# Patient Record
Sex: Female | Born: 1975
Health system: Southern US, Community
[De-identification: ages and names within clinical notes are randomized; demographics above are authoritative.]

## PROBLEM LIST (undated history)

## (undated) DIAGNOSIS — K8050 Calculus of bile duct without cholangitis or cholecystitis without obstruction: Secondary | ICD-10-CM

## (undated) DIAGNOSIS — I341 Nonrheumatic mitral (valve) prolapse: Secondary | ICD-10-CM

## (undated) DIAGNOSIS — K219 Gastro-esophageal reflux disease without esophagitis: Secondary | ICD-10-CM

## (undated) HISTORY — DX: Calculus of bile duct without cholangitis or cholecystitis without obstruction: K80.50

## (undated) HISTORY — DX: Nonrheumatic mitral (valve) prolapse: I34.1

## (undated) HISTORY — DX: Gastro-esophageal reflux disease without esophagitis: K21.9

---

## 2002-09-30 ENCOUNTER — Encounter: Admission: RE | Admit: 2002-09-30 | Discharge: 2002-09-30 | Payer: Self-pay | Admitting: General Surgery

## 2002-09-30 ENCOUNTER — Encounter: Payer: Self-pay | Admitting: General Surgery

## 2003-07-10 ENCOUNTER — Encounter: Admission: RE | Admit: 2003-07-10 | Discharge: 2003-07-10 | Payer: Self-pay | Admitting: General Surgery

## 2005-01-27 HISTORY — PX: DILATION AND CURETTAGE OF UTERUS: SHX78

## 2006-08-08 ENCOUNTER — Emergency Department: Payer: Self-pay | Admitting: Emergency Medicine

## 2007-01-01 ENCOUNTER — Ambulatory Visit: Payer: Self-pay

## 2007-11-18 ENCOUNTER — Ambulatory Visit: Payer: Self-pay

## 2009-01-27 DIAGNOSIS — K8050 Calculus of bile duct without cholangitis or cholecystitis without obstruction: Secondary | ICD-10-CM

## 2009-01-27 DIAGNOSIS — K219 Gastro-esophageal reflux disease without esophagitis: Secondary | ICD-10-CM

## 2009-01-27 HISTORY — DX: Calculus of bile duct without cholangitis or cholecystitis without obstruction: K80.50

## 2009-01-27 HISTORY — DX: Gastro-esophageal reflux disease without esophagitis: K21.9

## 2009-03-13 ENCOUNTER — Ambulatory Visit: Payer: Self-pay

## 2010-08-28 ENCOUNTER — Ambulatory Visit: Payer: Self-pay

## 2010-09-10 ENCOUNTER — Ambulatory Visit: Payer: Self-pay | Admitting: Family Medicine

## 2010-09-27 ENCOUNTER — Emergency Department: Payer: Self-pay | Admitting: Emergency Medicine

## 2011-01-28 HISTORY — PX: BREAST BIOPSY: SHX20

## 2011-02-13 ENCOUNTER — Ambulatory Visit: Payer: Self-pay | Admitting: Neurology

## 2011-09-02 ENCOUNTER — Ambulatory Visit: Payer: Self-pay | Admitting: Obstetrics and Gynecology

## 2011-09-11 HISTORY — PX: BREAST SURGERY: SHX581

## 2011-10-13 ENCOUNTER — Ambulatory Visit: Payer: Self-pay | Admitting: General Surgery

## 2012-03-05 ENCOUNTER — Encounter: Payer: Self-pay | Admitting: *Deleted

## 2012-04-12 ENCOUNTER — Ambulatory Visit: Payer: Self-pay | Admitting: General Surgery

## 2012-04-19 ENCOUNTER — Encounter: Payer: Self-pay | Admitting: General Surgery

## 2012-04-19 ENCOUNTER — Ambulatory Visit (INDEPENDENT_AMBULATORY_CARE_PROVIDER_SITE_OTHER): Payer: Managed Care, Other (non HMO) | Admitting: General Surgery

## 2012-04-19 VITALS — BP 115/82 | HR 84 | Resp 14 | Ht 62.5 in | Wt 162.0 lb

## 2012-04-19 DIAGNOSIS — Z09 Encounter for follow-up examination after completed treatment for conditions other than malignant neoplasm: Secondary | ICD-10-CM

## 2012-04-19 NOTE — Patient Instructions (Signed)
Patient advised to have next mammogram at age 37.

## 2012-04-19 NOTE — Progress Notes (Signed)
Patient ID: [name], female   DOB: [birth year], 37 y.o.   MRN: [id number]  Chief Complaint  Patient presents with  . Follow-up    mammogram    HPI Kristie Garcia is a 37 y.o. female here today for her 6 follow up mammogram done @ [facility] on 04/12/12. No breast complaints at this time.  HPI  Past Medical History  Diagnosis Date  . Gall bladder pain 2011  . Mitral valve prolapse   . Acid reflux 2011    Past Surgical History  Procedure Laterality Date  . Dilation and curettage of uterus  2007  . Breast surgery  09/11/2011    left breast 4 oclock    Family History  Problem Relation Age of Onset  . Breast cancer Paternal Grandmother 38  . Breast cancer Maternal Aunt 43  . Lung cancer Paternal Grandfather 7    Social History History  Substance Use Topics  . Smoking status: Never Smoker   . Smokeless tobacco: Never Used  . Alcohol Use: No    Allergies  Allergen Reactions  . Betadine (Povidone Iodine) Hives  . Codeine Nausea And Vomiting  . Penicillins Rash    No current outpatient prescriptions on file.   No current facility-administered medications for this visit.    Review of Systems Review of Systems  Constitutional: Negative.   Respiratory: Negative.   Cardiovascular: Negative.     Blood pressure 115/82, pulse 84, resp. rate 14, height 5' 2.5" (1.588 m), weight 162 lb (73.483 kg), last menstrual period 04/18/2012.  Physical Exam Physical Exam  Constitutional: She appears well-developed and well-nourished.  Neck: Trachea normal and normal range of motion. Neck supple.  Cardiovascular: Normal rate, regular rhythm and normal heart sounds.   Pulmonary/Chest: Right breast exhibits no inverted nipple, no mass, no nipple discharge, no skin change and no tenderness. Left breast exhibits no inverted nipple, no mass, no nipple discharge, no skin change and no tenderness.  Right breast fuller than left. Single duct drainage in right breast.     Data  Reviewed  Unilateral left breast mammogram dated 04/12/2012 showed no interval change. Postbiopsy clip was identified and unchanged from postbiopsy exam on 10/13/2011. BIRAD-2.  Left breast biopsy dated 09/12/2011:  BREAST, LEFT 4:00, BIOPSY *STAT*:  - BENIGN BREAST TISSUE WITH INFLAMED GRANULATION TISSUE, SMALL  ABSCESS FORMATION, REACTIVE STROMAL CHANGES AND FIBROSIS, AND  SURROUNDING DUCT ECTASIA.  - NEGATIVE FOR ATYPIA AND MALIGNANCY.    Assessment    Benign breast exam.    Plan    The patient was encouraged to do monthly self examinations. Bilateral screening mammogram should be completed at age 61       Earline Mayotte 04/19/2012, 9:22 PM  Cc:  Odis Luster, M.D.

## 2012-05-04 ENCOUNTER — Ambulatory Visit: Payer: Self-pay | Admitting: General Practice

## 2012-11-01 ENCOUNTER — Ambulatory Visit (INDEPENDENT_AMBULATORY_CARE_PROVIDER_SITE_OTHER): Payer: Managed Care, Other (non HMO) | Admitting: General Surgery

## 2012-11-01 ENCOUNTER — Encounter: Payer: Self-pay | Admitting: General Surgery

## 2012-11-01 ENCOUNTER — Other Ambulatory Visit: Payer: Managed Care, Other (non HMO)

## 2012-11-01 VITALS — BP 124/68 | HR 74 | Resp 12 | Ht 62.5 in | Wt 157.0 lb

## 2012-11-01 DIAGNOSIS — N63 Unspecified lump in unspecified breast: Secondary | ICD-10-CM

## 2012-11-01 NOTE — Patient Instructions (Addendum)
Patient to continue monthly self breast checks. Patient to follow up in 6 months.

## 2012-11-01 NOTE — Progress Notes (Signed)
Patient ID: [name], female   DOB: 1975-06-02, 37 y.o.   MRN: [id number]  Chief Complaint  Patient presents with  . Follow-up    reevaluation of left breast mass    HPI Kristie Garcia is a 37 y.o. female who presents for a reevaluation of a left breast mass. Most recent mammogram was done on 04/12/12 with a birad category 2. She states since her left breast biopsy in 2013 she has had this pulling sensation as well as occasional pain. She described the pain as a dull ache that lasts about 2 seconds and then it disappears. 8-10 episodes of pain monthly. No patterns associated to the pain. Marble size area in the upper quadrant of the left breast that seems to be getting larger. (12:00 position). Breast identified with the patient seated.   HPI  Past Medical History  Diagnosis Date  . Gall bladder pain 2011  . Mitral valve prolapse   . Acid reflux 2011    Past Surgical History  Procedure Laterality Date  . Dilation and curettage of uterus  2007  . Breast surgery  09/11/2011    left breast 4 oclock    Family History  Problem Relation Age of Onset  . Breast cancer Paternal Grandmother 49  . Breast cancer Maternal Aunt 92  . Lung cancer Paternal Grandfather 80    Social History History  Substance Use Topics  . Smoking status: Never Smoker   . Smokeless tobacco: Never Used  . Alcohol Use: No    Allergies  Allergen Reactions  . Betadine [Povidone Iodine] Hives  . Codeine Nausea And Vomiting  . Penicillins Rash    No current outpatient prescriptions on file.   No current facility-administered medications for this visit.    Review of Systems Review of Systems  Constitutional: Negative.   Respiratory: Negative.   Cardiovascular: Negative.     Blood pressure 124/68, pulse 74, resp. rate 12, height 5' 2.5" (1.588 m), weight 157 lb (71.215 kg), last menstrual period 10/24/2012.  Physical Exam Physical Exam  Constitutional: She is oriented to person, place, and  time. She appears well-developed and well-nourished.  Neck: No thyromegaly present.  Cardiovascular: Normal rate, regular rhythm and normal heart sounds.   No murmur heard. Pulmonary/Chest: Effort normal and breath sounds normal. Right breast exhibits no inverted nipple, no mass, no nipple discharge, no skin change and no tenderness. Left breast exhibits no inverted nipple, no mass, no nipple discharge, no skin change and no tenderness.    Little thickening at 12 o'clock 12 cm from the nipple of the left breast.   Lymphadenopathy:    She has no cervical adenopathy.    She has no axillary adenopathy.  Neurological: She is alert and oriented to person, place, and time.  Skin: Skin is warm and dry.    Data Reviewed Left breast mammogram dated 04/12/2012 showed scattered fibroglandular tissue. Postbiopsy clip in the inferior portion of the central left breast. BI-RAD-2. Ultrasound examination of the left breast in the 11:00 position, 10 cm from the nipple showed a small 0.15 x 0.34 x 0.5 cm simple cyst. At the 6:00 position, 5 cm from the nipple a multilobulated hypoechoic area with faint acoustic enhancement measuring 0.2 x 0.5 one by 0.52 cm was appreciated. The patient's previous biopsy was in the 4:00 position of the left breast, 5 cm from the nipple.  Assessment    Benign breast exam.    Plan    We'll arrange for a  followup exam with a repeat ultrasound of the left breast to be undertaken at that time.       Earline Mayotte 11/02/2012, 12:53 PM

## 2012-11-02 ENCOUNTER — Encounter: Payer: Self-pay | Admitting: General Surgery

## 2012-11-11 ENCOUNTER — Encounter: Payer: Self-pay | Admitting: General Surgery

## 2012-12-02 ENCOUNTER — Other Ambulatory Visit: Payer: Self-pay

## 2013-04-26 ENCOUNTER — Ambulatory Visit: Payer: Managed Care, Other (non HMO) | Admitting: General Surgery

## 2013-05-04 ENCOUNTER — Ambulatory Visit: Payer: Managed Care, Other (non HMO) | Admitting: General Surgery

## 2013-05-12 ENCOUNTER — Ambulatory Visit: Payer: Managed Care, Other (non HMO) | Admitting: General Surgery

## 2013-05-26 ENCOUNTER — Encounter: Payer: Self-pay | Admitting: *Deleted

## 2013-07-08 ENCOUNTER — Other Ambulatory Visit: Payer: Self-pay | Admitting: Emergency Medicine

## 2013-07-08 LAB — CBC
HCT: 43 % (ref 35.0–47.0)
HGB: 14.7 g/dL (ref 12.0–16.0)
MCH: 30.3 pg (ref 26.0–34.0)
MCHC: 34.2 g/dL (ref 32.0–36.0)
MCV: 89 fL (ref 80–100)
PLATELETS: 207 10*3/uL (ref 150–440)
RBC: 4.85 10*6/uL (ref 3.80–5.20)
RDW: 12.5 % (ref 11.5–14.5)
WBC: 6.3 10*3/uL (ref 3.6–11.0)

## 2013-07-08 LAB — COMPREHENSIVE METABOLIC PANEL
AST: 12 U/L — AB (ref 15–37)
Albumin: 4.1 g/dL (ref 3.4–5.0)
Alkaline Phosphatase: 68 U/L
Anion Gap: 6 — ABNORMAL LOW (ref 7–16)
BILIRUBIN TOTAL: 0.9 mg/dL (ref 0.2–1.0)
BUN: 9 mg/dL (ref 7–18)
CHLORIDE: 105 mmol/L (ref 98–107)
Calcium, Total: 9 mg/dL (ref 8.5–10.1)
Co2: 28 mmol/L (ref 21–32)
Creatinine: 0.75 mg/dL (ref 0.60–1.30)
GLUCOSE: 104 mg/dL — AB (ref 65–99)
OSMOLALITY: 277 (ref 275–301)
POTASSIUM: 3.6 mmol/L (ref 3.5–5.1)
SGPT (ALT): 19 U/L (ref 12–78)
SODIUM: 139 mmol/L (ref 136–145)
Total Protein: 7.1 g/dL (ref 6.4–8.2)

## 2013-07-08 LAB — TSH: Thyroid Stimulating Horm: 2.59 u[IU]/mL

## 2013-09-05 ENCOUNTER — Ambulatory Visit: Payer: BC Managed Care – PPO

## 2013-09-05 ENCOUNTER — Ambulatory Visit (INDEPENDENT_AMBULATORY_CARE_PROVIDER_SITE_OTHER): Payer: BC Managed Care – PPO | Admitting: General Surgery

## 2013-09-05 ENCOUNTER — Encounter: Payer: Self-pay | Admitting: General Surgery

## 2013-09-05 VITALS — BP 124/76 | HR 76 | Resp 12 | Ht 62.5 in | Wt 165.0 lb

## 2013-09-05 DIAGNOSIS — N63 Unspecified lump in unspecified breast: Secondary | ICD-10-CM

## 2013-09-05 NOTE — Patient Instructions (Signed)
Patient to return as needed. The patient is aware to call back for any questions or concerns. 

## 2013-09-05 NOTE — Progress Notes (Signed)
Patient ID: Kristie LaundryMissy M Garcia, female   DOB: 01-Sep-1975, 38 y.o.   MRN: [id number]  Chief Complaint  Patient presents with  . Follow-up    6 month follow up breast cyst    HPI Kristie Garcia is a 38 y.o. female who presents for a 6 month follow up of a left breast cyst. The patient denies any new problems at this time.   HPI  Past Medical History  Diagnosis Date  . Gall bladder pain 2011  . Mitral valve prolapse   . Acid reflux 2011    Past Surgical History  Procedure Laterality Date  . Dilation and curettage of uterus  2007  . Breast surgery  09/11/2011    left breast 4 oclock    Family History  Problem Relation Age of Onset  . Breast cancer Paternal Grandmother 3468  . Breast cancer Maternal Aunt 8368  . Lung cancer Paternal Grandfather 3060    Social History History  Substance Use Topics  . Smoking status: Never Smoker   . Smokeless tobacco: Never Used  . Alcohol Use: No    Allergies  Allergen Reactions  . Betadine [Povidone Iodine] Hives  . Codeine Nausea And Vomiting  . Penicillins Rash    No current outpatient prescriptions on file.   No current facility-administered medications for this visit.    Review of Systems Review of Systems  Constitutional: Negative.   Respiratory: Negative.   Cardiovascular: Negative.     Blood pressure 124/76, pulse 76, resp. rate 12, height 5' 2.5" (1.588 m), weight 165 lb (74.844 kg), last menstrual period 08/15/2013.  Physical Exam Physical Exam  Constitutional: She is oriented to person, place, and time. She appears well-developed and well-nourished.  Neck: Neck supple. No thyromegaly present.  Cardiovascular: Normal rate, regular rhythm and normal heart sounds.   No murmur heard. Pulmonary/Chest: Effort normal and breath sounds normal. Right breast exhibits no inverted nipple, no mass, no nipple discharge, no skin change and no tenderness. Left breast exhibits mass. Left breast exhibits no inverted nipple, no nipple  discharge, no skin change and no tenderness.  Left breast 11 o'clock 13 cm from the nipple mild prominence is noted when examined in the seated position. In the supine position this is less distinct.  Lymphadenopathy:    She has no cervical adenopathy.    She has no axillary adenopathy.  Neurological: She is alert and oriented to person, place, and time.  Skin: Skin is warm and dry.    Data Reviewed Ultrasound examination of the 11 o'clock position 10 cm no nipple again shows a lenticular shape area measuring less than 0.5 cm in diameter. This is slightly smaller than on her last exam.no acoustic enhancement are shadowing. This is indeterminate, but as is decreased in size no further intervention is required. At the 6 o'clock position the previously identified ill-defined nodule is no longer evident. Examination of the retroareolar area shows mildly prominent ductal structures measuring up to 0.33 cm in diameter. No intraductal lesions are noted. BI-RAD-2.  Assessment    Benign breast exam.     Plan    The patient should have a screening mammogram at age 38. This can be arranged to the GYN office. She was encouraged to call should appreciate any change in her breast exam, followup here otherwise will be on an as-needed basis.      PCP: Kristie Garcia   Kristie Garcia 09/05/2013, 8:51 PM

## 2013-11-28 ENCOUNTER — Encounter: Payer: Self-pay | Admitting: General Surgery

## 2014-04-12 ENCOUNTER — Encounter: Payer: Self-pay | Admitting: General Surgery

## 2014-04-12 ENCOUNTER — Ambulatory Visit: Payer: Self-pay | Admitting: General Surgery

## 2015-09-28 ENCOUNTER — Other Ambulatory Visit: Payer: Self-pay | Admitting: Nurse Practitioner

## 2015-09-28 DIAGNOSIS — R1013 Epigastric pain: Secondary | ICD-10-CM

## 2015-09-28 DIAGNOSIS — Z8719 Personal history of other diseases of the digestive system: Secondary | ICD-10-CM

## 2015-10-04 ENCOUNTER — Encounter: Admission: RE | Admit: 2015-10-04 | Payer: 59 | Source: Ambulatory Visit

## 2015-10-05 ENCOUNTER — Ambulatory Visit
Admission: RE | Admit: 2015-10-05 | Discharge: 2015-10-05 | Disposition: A | Discharge status: Home / Self Care | Payer: 59 | Source: Ambulatory Visit | Attending: Nurse Practitioner | Admitting: Nurse Practitioner

## 2015-10-05 ENCOUNTER — Ambulatory Visit: Payer: 59

## 2015-10-05 DIAGNOSIS — Z8719 Personal history of other diseases of the digestive system: Secondary | ICD-10-CM | POA: Insufficient documentation

## 2015-10-05 DIAGNOSIS — R1013 Epigastric pain: Secondary | ICD-10-CM | POA: Diagnosis present

## 2016-09-05 DIAGNOSIS — I493 Ventricular premature depolarization: Secondary | ICD-10-CM | POA: Diagnosis not present

## 2016-09-05 DIAGNOSIS — I341 Nonrheumatic mitral (valve) prolapse: Secondary | ICD-10-CM | POA: Diagnosis not present

## 2016-09-05 DIAGNOSIS — R002 Palpitations: Secondary | ICD-10-CM | POA: Diagnosis not present

## 2016-09-11 DIAGNOSIS — R002 Palpitations: Secondary | ICD-10-CM | POA: Diagnosis not present

## 2016-09-11 DIAGNOSIS — I10 Essential (primary) hypertension: Secondary | ICD-10-CM | POA: Diagnosis not present

## 2016-09-11 DIAGNOSIS — I493 Ventricular premature depolarization: Secondary | ICD-10-CM | POA: Diagnosis not present

## 2016-09-24 ENCOUNTER — Encounter: Payer: Self-pay | Admitting: Physician Assistant

## 2016-09-24 ENCOUNTER — Ambulatory Visit: Payer: Self-pay | Admitting: Physician Assistant

## 2016-09-24 VITALS — BP 122/80 | HR 71 | Temp 98.5°F

## 2016-09-24 DIAGNOSIS — Z7189 Other specified counseling: Secondary | ICD-10-CM

## 2016-09-24 NOTE — Progress Notes (Signed)
S: pt c/o feeling like she has a lump in her throat, states she had been having palpitations and was given propranolol, said she has been taking bid for 5 days, worried that the sensation is from the medication, denies rash, itching, dif breathing, throat or lips swelling, headaches, n/v  O: vitals wnl nad, mouth and throat wnl, neck supple no lymph, lungs c t a,cv rrr, skin without rash  A: medication counseling  P: if sx worsen then f/u with pcp, if dif breathing go to ER

## 2016-12-11 DIAGNOSIS — Z23 Encounter for immunization: Secondary | ICD-10-CM | POA: Diagnosis not present

## 2016-12-11 DIAGNOSIS — Z0000 Encounter for general adult medical examination without abnormal findings: Secondary | ICD-10-CM | POA: Diagnosis not present

## 2016-12-11 DIAGNOSIS — G43109 Migraine with aura, not intractable, without status migrainosus: Secondary | ICD-10-CM | POA: Diagnosis not present

## 2016-12-11 DIAGNOSIS — K219 Gastro-esophageal reflux disease without esophagitis: Secondary | ICD-10-CM | POA: Diagnosis not present

## 2016-12-15 DIAGNOSIS — Z0000 Encounter for general adult medical examination without abnormal findings: Secondary | ICD-10-CM | POA: Diagnosis not present

## 2016-12-30 DIAGNOSIS — L578 Other skin changes due to chronic exposure to nonionizing radiation: Secondary | ICD-10-CM | POA: Diagnosis not present

## 2016-12-30 DIAGNOSIS — Z86018 Personal history of other benign neoplasm: Secondary | ICD-10-CM | POA: Diagnosis not present

## 2017-08-13 ENCOUNTER — Other Ambulatory Visit: Payer: Self-pay | Admitting: Obstetrics & Gynecology

## 2017-08-13 DIAGNOSIS — Z01419 Encounter for gynecological examination (general) (routine) without abnormal findings: Secondary | ICD-10-CM | POA: Diagnosis not present

## 2017-08-13 DIAGNOSIS — Z1231 Encounter for screening mammogram for malignant neoplasm of breast: Secondary | ICD-10-CM | POA: Diagnosis not present

## 2017-08-13 DIAGNOSIS — N6452 Nipple discharge: Secondary | ICD-10-CM | POA: Diagnosis not present

## 2017-08-14 ENCOUNTER — Ambulatory Visit
Admission: RE | Admit: 2017-08-14 | Discharge: 2017-08-14 | Disposition: A | Discharge status: Home / Self Care | Payer: 59 | Source: Ambulatory Visit | Attending: Obstetrics & Gynecology | Admitting: Obstetrics & Gynecology

## 2017-08-14 DIAGNOSIS — Z1231 Encounter for screening mammogram for malignant neoplasm of breast: Secondary | ICD-10-CM

## 2017-08-17 DIAGNOSIS — I493 Ventricular premature depolarization: Secondary | ICD-10-CM | POA: Diagnosis not present

## 2017-08-17 DIAGNOSIS — E782 Mixed hyperlipidemia: Secondary | ICD-10-CM | POA: Diagnosis not present

## 2017-08-17 DIAGNOSIS — I10 Essential (primary) hypertension: Secondary | ICD-10-CM | POA: Diagnosis not present

## 2017-11-13 DIAGNOSIS — R1013 Epigastric pain: Secondary | ICD-10-CM | POA: Diagnosis not present

## 2017-11-13 DIAGNOSIS — R1011 Right upper quadrant pain: Secondary | ICD-10-CM | POA: Diagnosis not present

## 2017-11-20 ENCOUNTER — Other Ambulatory Visit: Payer: Self-pay | Admitting: Family Medicine

## 2017-11-20 DIAGNOSIS — R1011 Right upper quadrant pain: Secondary | ICD-10-CM

## 2017-11-23 ENCOUNTER — Ambulatory Visit
Admission: RE | Admit: 2017-11-23 | Discharge: 2017-11-23 | Disposition: A | Discharge status: Home / Self Care | Payer: 59 | Source: Ambulatory Visit | Attending: Family Medicine | Admitting: Family Medicine

## 2017-11-23 DIAGNOSIS — R1011 Right upper quadrant pain: Secondary | ICD-10-CM | POA: Diagnosis not present

## 2017-11-23 DIAGNOSIS — R1013 Epigastric pain: Secondary | ICD-10-CM | POA: Insufficient documentation

## 2017-12-15 DIAGNOSIS — K219 Gastro-esophageal reflux disease without esophagitis: Secondary | ICD-10-CM | POA: Diagnosis not present

## 2017-12-15 DIAGNOSIS — G43109 Migraine with aura, not intractable, without status migrainosus: Secondary | ICD-10-CM | POA: Diagnosis not present

## 2017-12-15 DIAGNOSIS — Z0000 Encounter for general adult medical examination without abnormal findings: Secondary | ICD-10-CM | POA: Diagnosis not present

## 2017-12-15 DIAGNOSIS — Z23 Encounter for immunization: Secondary | ICD-10-CM | POA: Diagnosis not present

## 2018-03-29 DIAGNOSIS — I493 Ventricular premature depolarization: Secondary | ICD-10-CM | POA: Diagnosis not present

## 2018-03-29 DIAGNOSIS — K219 Gastro-esophageal reflux disease without esophagitis: Secondary | ICD-10-CM | POA: Diagnosis not present

## 2018-06-18 DIAGNOSIS — H169 Unspecified keratitis: Secondary | ICD-10-CM | POA: Diagnosis not present

## 2018-12-21 ENCOUNTER — Other Ambulatory Visit: Payer: Self-pay | Admitting: Family Medicine

## 2018-12-21 DIAGNOSIS — Z1231 Encounter for screening mammogram for malignant neoplasm of breast: Secondary | ICD-10-CM

## 2018-12-30 ENCOUNTER — Ambulatory Visit
Admission: RE | Admit: 2018-12-30 | Discharge: 2018-12-30 | Disposition: A | Discharge status: Home / Self Care | Payer: 59 | Source: Ambulatory Visit | Attending: Family Medicine | Admitting: Family Medicine

## 2018-12-30 ENCOUNTER — Other Ambulatory Visit: Payer: Self-pay

## 2018-12-30 DIAGNOSIS — Z1231 Encounter for screening mammogram for malignant neoplasm of breast: Secondary | ICD-10-CM | POA: Insufficient documentation

## 2019-01-05 ENCOUNTER — Ambulatory Visit
Admission: RE | Admit: 2019-01-05 | Discharge: 2019-01-05 | Disposition: A | Discharge status: Home / Self Care | Payer: 59 | Source: Ambulatory Visit | Attending: Family Medicine | Admitting: Family Medicine

## 2019-01-05 ENCOUNTER — Other Ambulatory Visit: Payer: Self-pay

## 2019-01-05 DIAGNOSIS — Z1231 Encounter for screening mammogram for malignant neoplasm of breast: Secondary | ICD-10-CM | POA: Insufficient documentation

## 2020-03-15 ENCOUNTER — Other Ambulatory Visit: Payer: Self-pay | Admitting: Family Medicine

## 2020-03-15 DIAGNOSIS — Z1231 Encounter for screening mammogram for malignant neoplasm of breast: Secondary | ICD-10-CM

## 2020-03-30 ENCOUNTER — Other Ambulatory Visit: Payer: Self-pay

## 2020-03-30 ENCOUNTER — Ambulatory Visit
Admission: RE | Admit: 2020-03-30 | Discharge: 2020-03-30 | Disposition: A | Discharge status: Home / Self Care | Payer: No Typology Code available for payment source | Source: Ambulatory Visit | Attending: Family Medicine | Admitting: Family Medicine

## 2020-03-30 DIAGNOSIS — Z1231 Encounter for screening mammogram for malignant neoplasm of breast: Secondary | ICD-10-CM | POA: Insufficient documentation

## 2020-11-27 IMAGING — MG DIGITAL SCREENING BILAT W/ TOMO W/ CAD
8 series · 8 of 24 positions shown · non-contrast
Comparison: Previous exam(s).

CLINICAL DATA: Screening.

EXAM:
DIGITAL SCREENING BILATERAL MAMMOGRAM WITH TOMO AND CAD

[L MLO synth-2D]
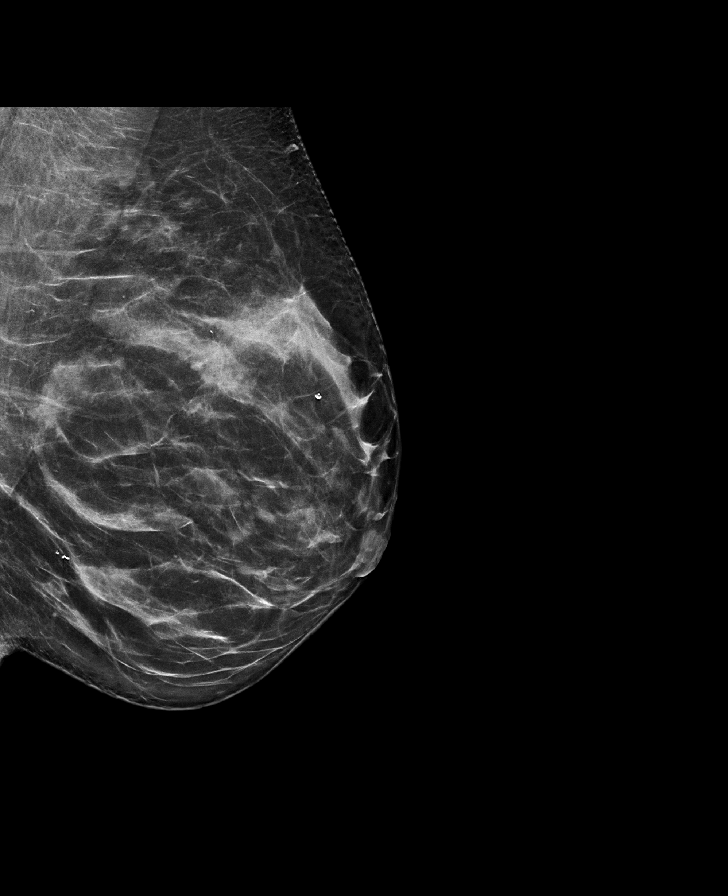

[L CC synth-2D]
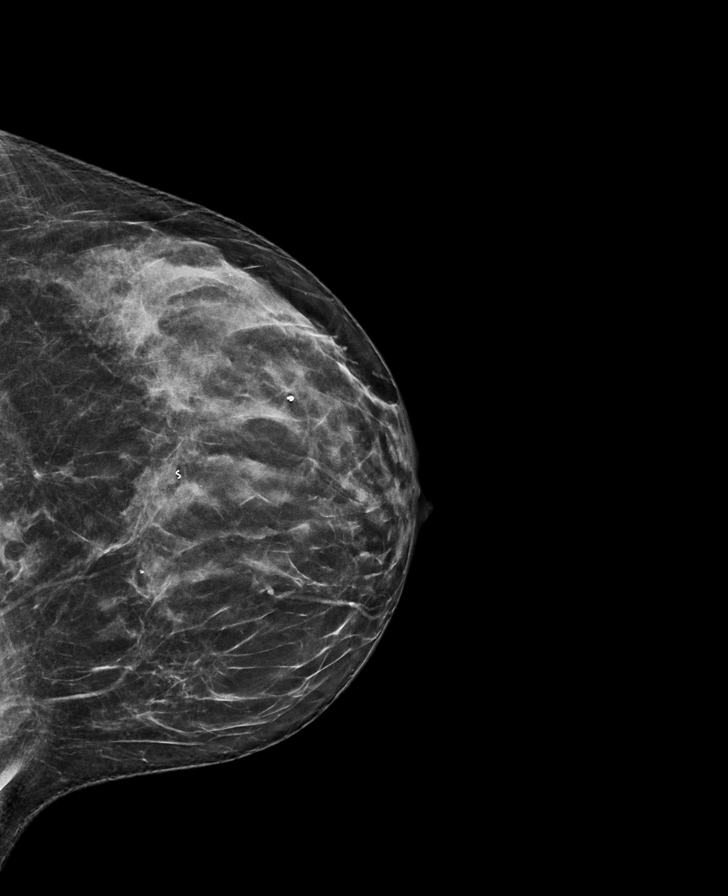

[R CC synth-2D]
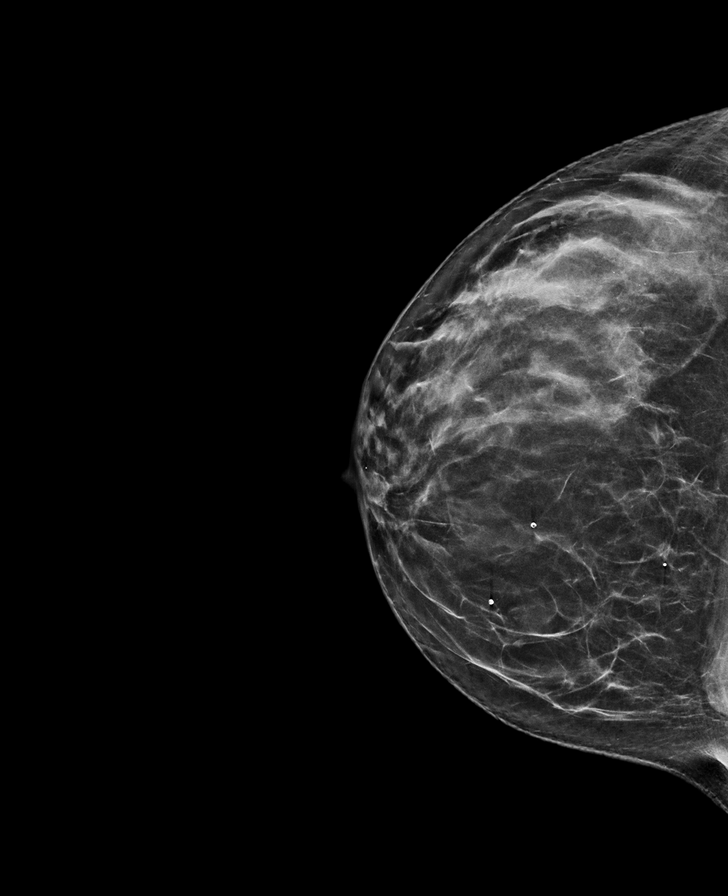

[R MLO synth-2D]
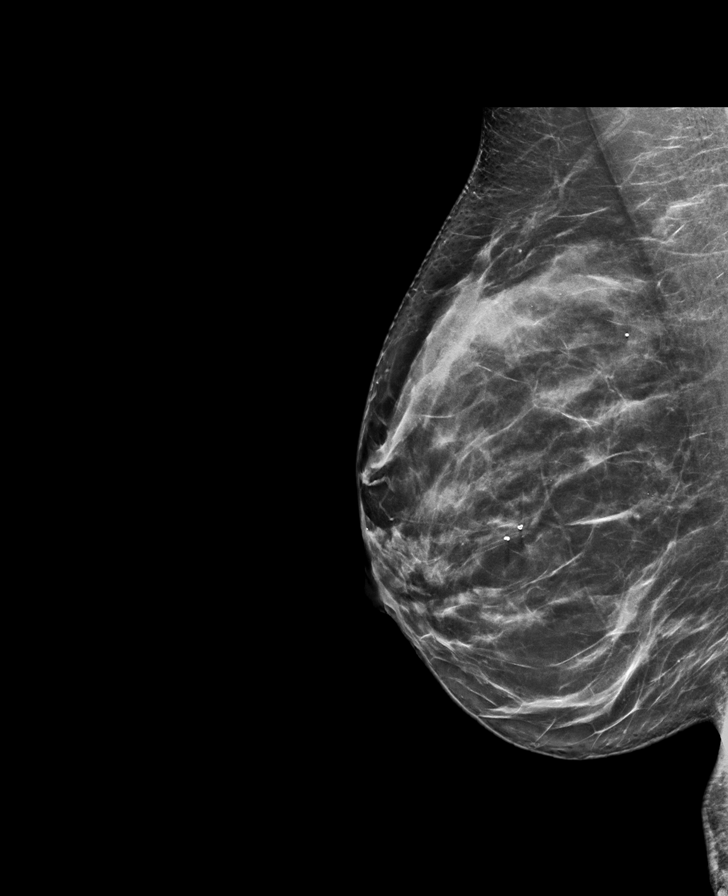

[R MLO tomo · tomo slice 37/73.0]
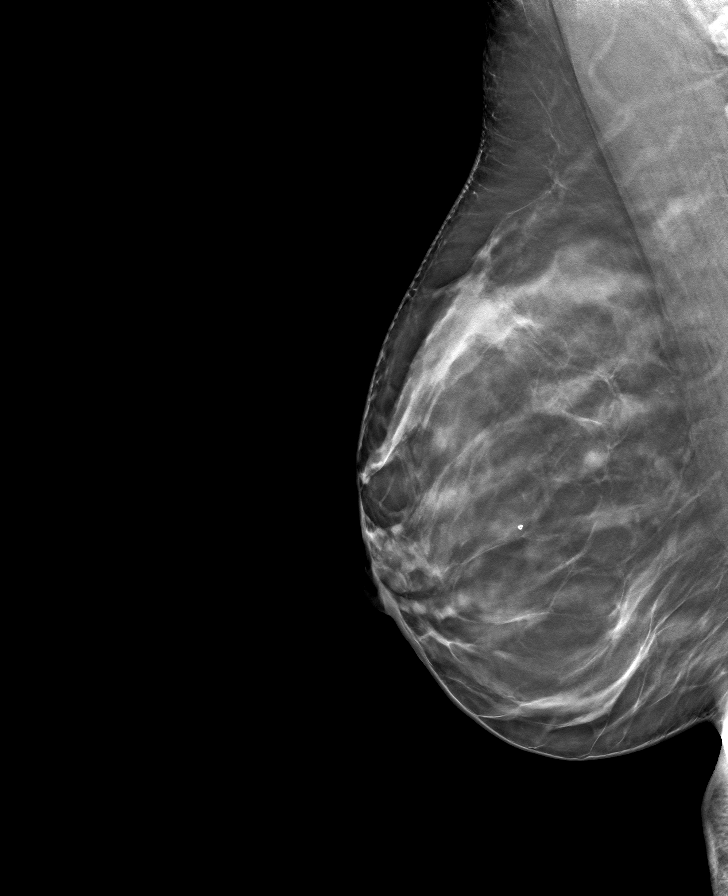

[L CC tomo · tomo slice 33/64.0]
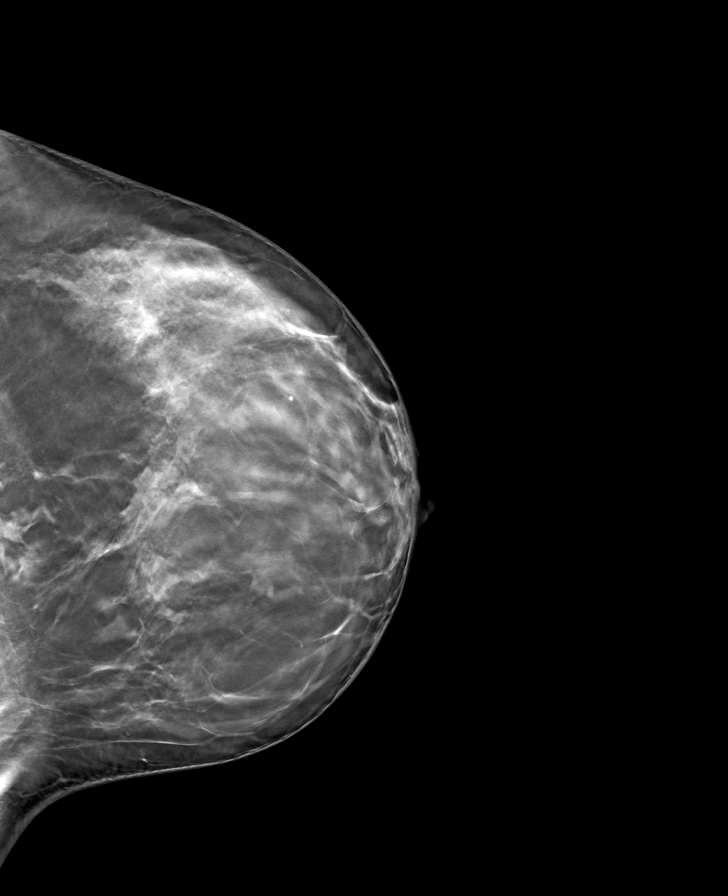

[R CC tomo · tomo slice 34/67.0]
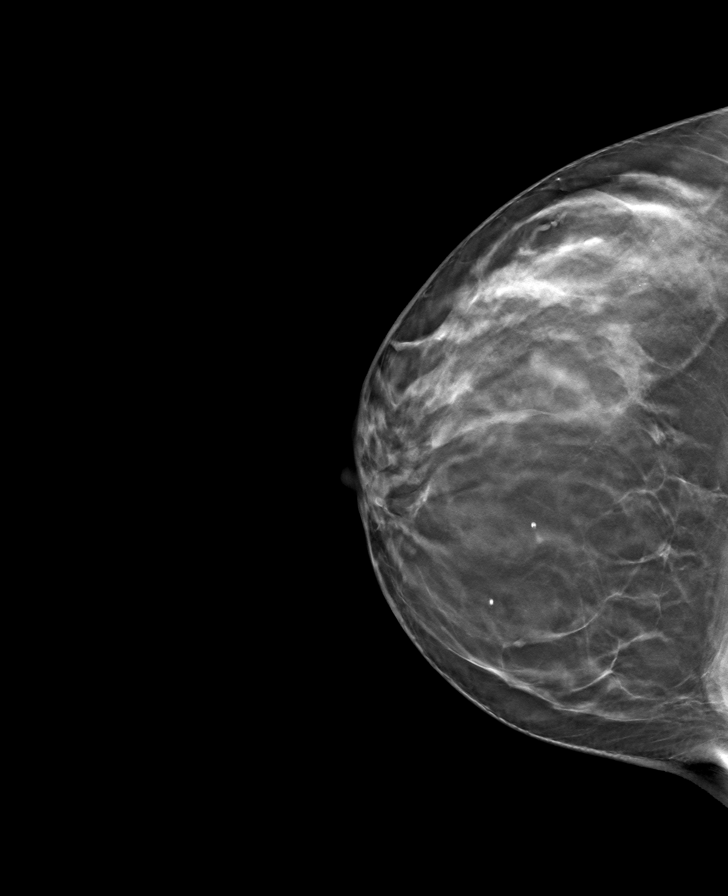

[L MLO tomo · tomo slice 36/71.0]
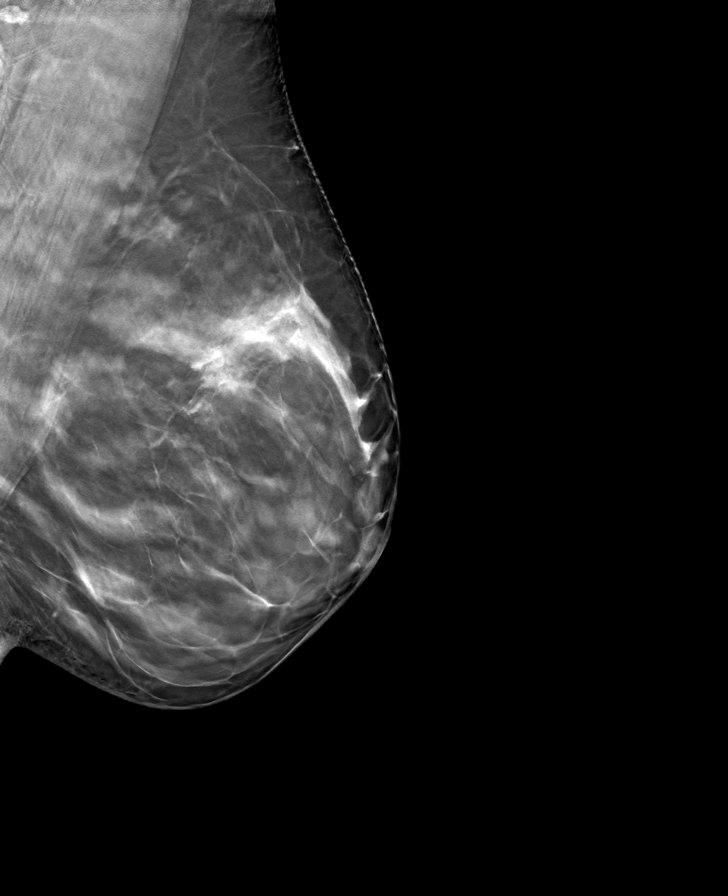

[8 of 24 positions shown; findings below may reference images not displayed]

ACR Breast Density Category c: The breast tissue is heterogeneously
dense, which may obscure small masses.
FINDINGS: There are no findings suspicious for malignancy. Images were
processed with CAD.
IMPRESSION: No mammographic evidence of malignancy. A result letter of this
screening mammogram will be mailed directly to the patient.

RECOMMENDATION:
Screening mammogram in one year. (Code:FT-U-LHB)

BI-RADS CATEGORY  1: Negative.

## 2021-05-02 ENCOUNTER — Other Ambulatory Visit: Payer: Self-pay | Admitting: Family Medicine

## 2021-05-02 DIAGNOSIS — Z1231 Encounter for screening mammogram for malignant neoplasm of breast: Secondary | ICD-10-CM

## 2021-06-07 ENCOUNTER — Ambulatory Visit
Admission: RE | Admit: 2021-06-07 | Discharge: 2021-06-07 | Disposition: A | Discharge status: Home / Self Care | Payer: No Typology Code available for payment source | Source: Ambulatory Visit | Attending: Family Medicine | Admitting: Family Medicine

## 2021-06-07 DIAGNOSIS — Z1231 Encounter for screening mammogram for malignant neoplasm of breast: Secondary | ICD-10-CM | POA: Diagnosis present

## 2021-06-13 ENCOUNTER — Other Ambulatory Visit: Payer: Self-pay | Admitting: Family Medicine

## 2021-06-13 DIAGNOSIS — R928 Other abnormal and inconclusive findings on diagnostic imaging of breast: Secondary | ICD-10-CM

## 2021-06-13 DIAGNOSIS — R921 Mammographic calcification found on diagnostic imaging of breast: Secondary | ICD-10-CM

## 2021-06-19 ENCOUNTER — Ambulatory Visit
Admission: RE | Admit: 2021-06-19 | Discharge: 2021-06-19 | Disposition: A | Discharge status: Home / Self Care | Payer: No Typology Code available for payment source | Source: Ambulatory Visit | Attending: Family Medicine | Admitting: Family Medicine

## 2021-06-19 DIAGNOSIS — R928 Other abnormal and inconclusive findings on diagnostic imaging of breast: Secondary | ICD-10-CM | POA: Diagnosis present

## 2021-06-19 DIAGNOSIS — R921 Mammographic calcification found on diagnostic imaging of breast: Secondary | ICD-10-CM | POA: Diagnosis present

## 2021-06-21 ENCOUNTER — Other Ambulatory Visit: Payer: Self-pay | Admitting: Family Medicine

## 2021-06-21 DIAGNOSIS — R921 Mammographic calcification found on diagnostic imaging of breast: Secondary | ICD-10-CM

## 2021-06-21 DIAGNOSIS — R928 Other abnormal and inconclusive findings on diagnostic imaging of breast: Secondary | ICD-10-CM

## 2021-07-09 ENCOUNTER — Ambulatory Visit
Admission: RE | Admit: 2021-07-09 | Discharge: 2021-07-09 | Disposition: A | Discharge status: Home / Self Care | Payer: No Typology Code available for payment source | Source: Ambulatory Visit | Attending: Family Medicine | Admitting: Family Medicine

## 2021-07-09 DIAGNOSIS — R928 Other abnormal and inconclusive findings on diagnostic imaging of breast: Secondary | ICD-10-CM

## 2021-07-09 DIAGNOSIS — R921 Mammographic calcification found on diagnostic imaging of breast: Secondary | ICD-10-CM | POA: Diagnosis present

## 2021-07-09 HISTORY — PX: BREAST BIOPSY: SHX20

## 2021-07-10 LAB — SURGICAL PATHOLOGY

## 2022-04-15 ENCOUNTER — Other Ambulatory Visit: Payer: Self-pay | Admitting: Family Medicine

## 2022-04-15 DIAGNOSIS — Z1231 Encounter for screening mammogram for malignant neoplasm of breast: Secondary | ICD-10-CM

## 2022-06-20 ENCOUNTER — Ambulatory Visit
Admission: RE | Admit: 2022-06-20 | Discharge: 2022-06-20 | Disposition: A | Discharge status: Home / Self Care | Payer: No Typology Code available for payment source | Source: Ambulatory Visit | Attending: Family Medicine | Admitting: Family Medicine

## 2022-06-20 DIAGNOSIS — Z1231 Encounter for screening mammogram for malignant neoplasm of breast: Secondary | ICD-10-CM | POA: Diagnosis present

## 2022-08-13 ENCOUNTER — Ambulatory Visit: Payer: No Typology Code available for payment source

## 2022-08-13 DIAGNOSIS — Z83719 Family history of colon polyps, unspecified: Secondary | ICD-10-CM | POA: Diagnosis not present

## 2022-08-13 DIAGNOSIS — Z1211 Encounter for screening for malignant neoplasm of colon: Secondary | ICD-10-CM | POA: Diagnosis not present

## 2022-08-13 DIAGNOSIS — K621 Rectal polyp: Secondary | ICD-10-CM | POA: Diagnosis not present

## 2022-11-21 ENCOUNTER — Other Ambulatory Visit: Payer: Self-pay | Admitting: Obstetrics and Gynecology

## 2022-11-21 DIAGNOSIS — Z1231 Encounter for screening mammogram for malignant neoplasm of breast: Secondary | ICD-10-CM

## 2023-04-30 ENCOUNTER — Other Ambulatory Visit: Payer: Self-pay | Admitting: Family Medicine

## 2023-04-30 DIAGNOSIS — Z1231 Encounter for screening mammogram for malignant neoplasm of breast: Secondary | ICD-10-CM

## 2023-05-12 IMAGING — MG MM DIGITAL DIAGNOSTIC UNILAT*R* W/ TOMO W/ CAD
4 series · 4 of 8 positions shown · non-contrast
Comparison: Previous exam(s).

CLINICAL DATA: Callback for RIGHT breast calcifications, seen on
MLO imaging

EXAM:
DIGITAL DIAGNOSTIC UNILATERAL RIGHT MAMMOGRAM WITH TOMOSYNTHESIS AND
CAD
TECHNIQUE: Right digital diagnostic mammography and breast tomosynthesis was
performed. The images were evaluated with computer-aided detection.

[R CC]
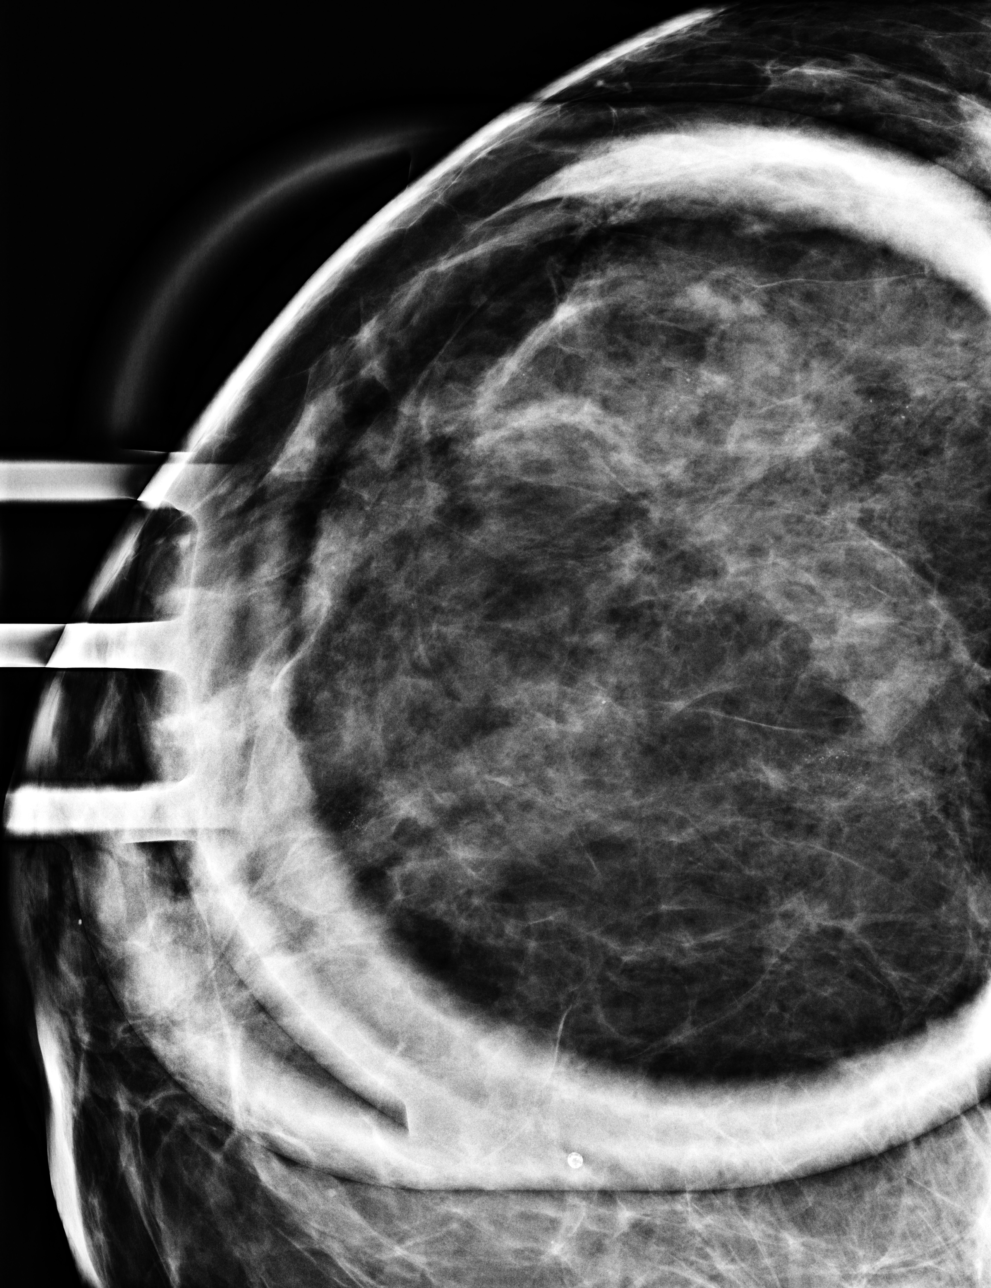

[R ML]
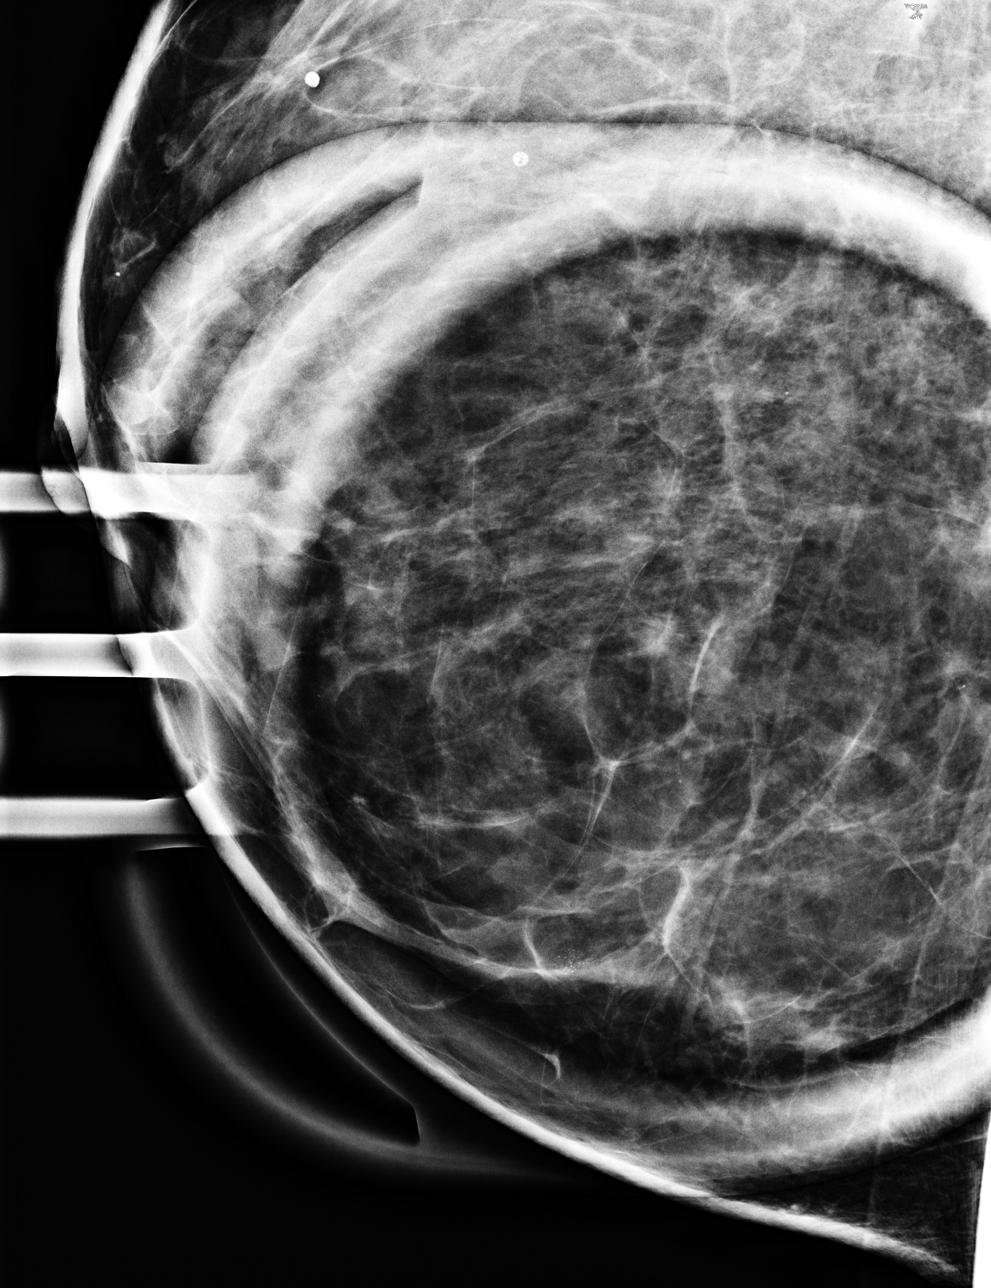

[R ML synth-2D]
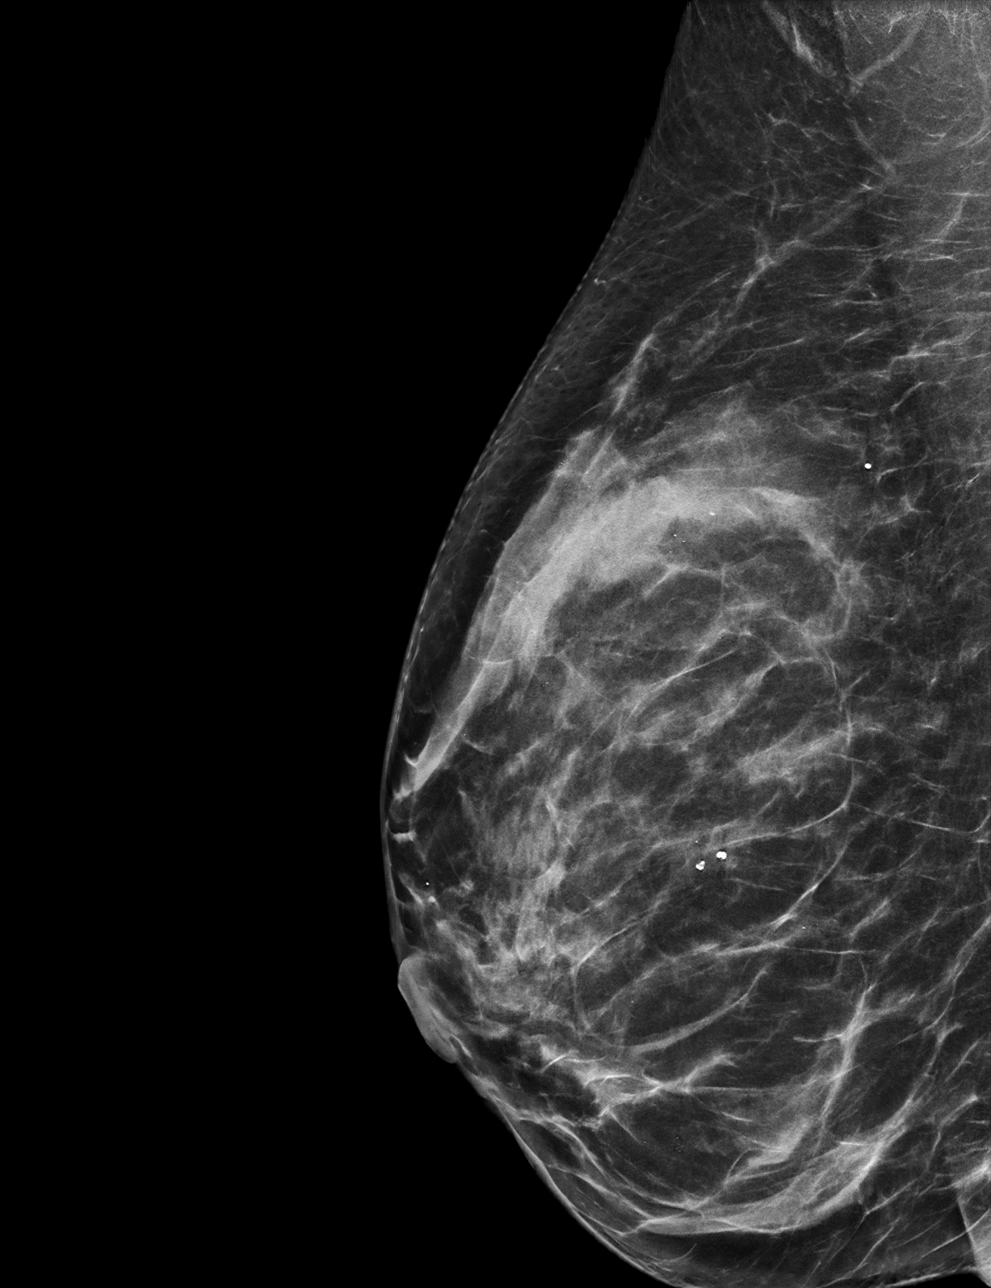

[R ML tomo · tomo slice 33/66.0]
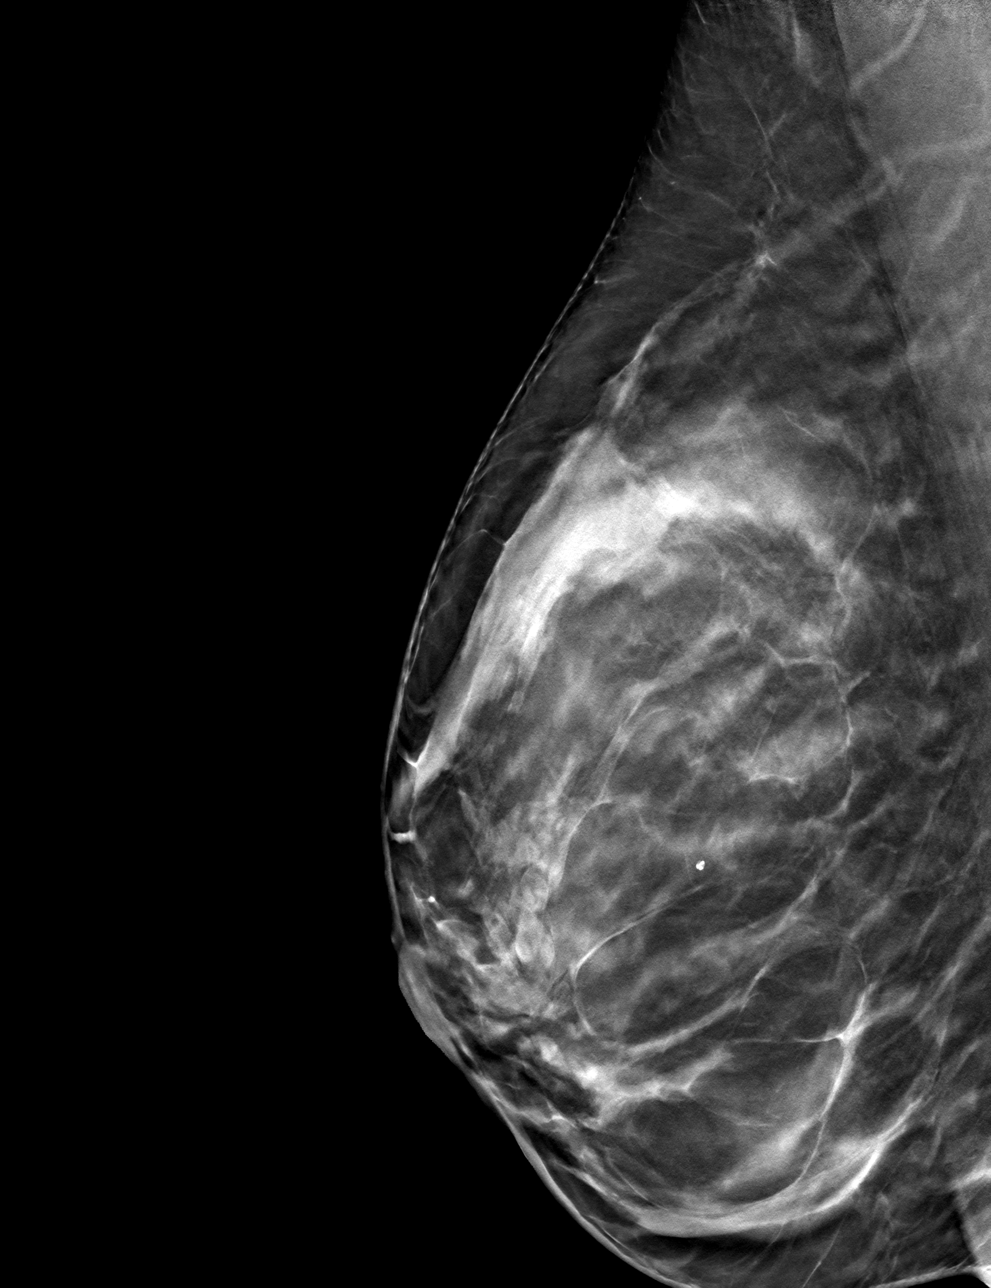

[4 of 8 positions shown; findings below may reference images not displayed]

ACR Breast Density Category c: The breast tissue is heterogeneously
dense, which may obscure small masses.
FINDINGS: Spot magnification views of the RIGHT breast demonstrate a 4 mm
group of punctate calcifications in the RIGHT lower outer breast at
middle depth. This group of calcifications is not definitively
stable in comparison to remote prior mammograms.

There are multiple scattered punctate calcifications noted on spot
CC magnification views throughout the RIGHT outer breast, limiting
ability to identify prospective CC correlate. These additional
scattered punctate calcifications are not significantly changed in
comparison to remote prior mammograms, most consistent with a benign
etiology.
IMPRESSION: 1. There is a 4 mm group of indeterminate punctate calcifications in
the RIGHT lower outer breast, best identified on true lateral
imaging. These are not definitively stable in comparison to remote
prior mammograms. Recommend stereotactic guided biopsy for
definitive characterization.

RECOMMENDATION:
RIGHT breast stereotactic guided biopsy x1

I have discussed the findings and recommendations with the patient.
The biopsy procedure was discussed with the patient and questions
were answered. Patient expressed their understanding of the biopsy
recommendation. Patient will be scheduled for biopsy at her earliest
convenience by the schedulers. Ordering provider will be notified.
If applicable, a reminder letter will be sent to the patient
regarding the next appointment.

BI-RADS CATEGORY  4: Suspicious.

## 2023-06-29 ENCOUNTER — Encounter

## 2023-08-25 ENCOUNTER — Ambulatory Visit
Admission: RE | Admit: 2023-08-25 | Discharge: 2023-08-25 | Disposition: A | Discharge status: Home / Self Care | Source: Ambulatory Visit | Attending: Family Medicine | Admitting: Family Medicine

## 2023-08-25 DIAGNOSIS — Z1231 Encounter for screening mammogram for malignant neoplasm of breast: Secondary | ICD-10-CM | POA: Diagnosis present

## 2023-09-01 ENCOUNTER — Other Ambulatory Visit: Payer: Self-pay | Admitting: Family Medicine

## 2023-09-01 DIAGNOSIS — R928 Other abnormal and inconclusive findings on diagnostic imaging of breast: Secondary | ICD-10-CM

## 2023-09-03 ENCOUNTER — Ambulatory Visit
Admission: RE | Admit: 2023-09-03 | Discharge: 2023-09-03 | Disposition: A | Discharge status: Home / Self Care | Source: Ambulatory Visit | Attending: Family Medicine | Admitting: Family Medicine

## 2023-09-03 DIAGNOSIS — R928 Other abnormal and inconclusive findings on diagnostic imaging of breast: Secondary | ICD-10-CM | POA: Diagnosis present
